# Patient Record
Sex: Female | Born: 1970 | Hispanic: No | Marital: Married | Smoking: Never smoker
Health system: Southern US, Community
[De-identification: ages and names within clinical notes are randomized; demographics above are authoritative.]

## PROBLEM LIST (undated history)

## (undated) HISTORY — PX: AUGMENTATION MAMMAPLASTY: SUR837

## (undated) HISTORY — PX: BLADDER SUSPENSION: SHX72

## (undated) HISTORY — PX: OTHER SURGICAL HISTORY: SHX169

## (undated) HISTORY — PX: SYMPATHECTOMY: SHX792

---

## 2008-01-26 ENCOUNTER — Ambulatory Visit (HOSPITAL_BASED_OUTPATIENT_CLINIC_OR_DEPARTMENT_OTHER): Admission: RE | Admit: 2008-01-26 | Discharge: 2008-01-26 | Payer: Self-pay | Admitting: Urology

## 2008-01-27 ENCOUNTER — Ambulatory Visit (HOSPITAL_COMMUNITY): Admission: RE | Admit: 2008-01-27 | Discharge: 2008-01-27 | Payer: Self-pay | Admitting: Urology

## 2010-10-23 NOTE — Op Note (Signed)
NAME:  Amy Campos, Amy Campos NO.:  [id number]   MEDICAL RECORD NO.:  [id number]          PATIENT TYPE:  AMB   LOCATION:  [facility]                         FACILITY:  [facility]   PHYSICIAN:  Excell Seltzer. Annabell Howells, M.D.    DATE OF BIRTH:  09-21-1970   DATE OF PROCEDURE:  01/26/2008  DATE OF DISCHARGE:                               OPERATIVE REPORT   PROCEDURE:  SPARC sling.   PREOPERATIVE DIAGNOSIS:  Stress urinary incontinence.   POSTOPERATIVE DIAGNOSIS:  Stress urinary incontinence.   SURGEON:  Excell Seltzer. Annabell Howells, M.D.   ANESTHESIA:  General.   DRAIN:  Foley catheter and vaginal pack.   BLOOD LOSS:  400 cc.   COMPLICATIONS:  None.   INDICATIONS:  Amy Campos is a 40 year old white female with stress  incontinence.  She is to undergo a sling.  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   FINDINGS AT PROCEDURE:  The patient is given Cipro.  She was taken to  the operating room were PAS hose were placed.  A general anesthetic was  induced and she was placed in lithotomy position.  Her mons was clipped.  She was prepped with Betadine solution and draped in the usual sterile  fashion.  A Foley catheter was inserted and the bladder was drained.  Two small incisions were made approximately 2 cm lateral to the midline  over the pubis, one on the right, one on the left.  The fat was spread  to the fascia.  A weighted vaginal retractor was placed.  The anterior  vaginal wall of the mid urethra was infiltrated with approximately 5 mL  1% lidocaine with epinephrine and an incision was made longitudinally  over the mid urethra.  The mucosa was elevated off the pubourethral  fascia, allowing placement of a finger to protect the urethra.  The  [facility] trocars were then brought onto the field.  The right was passed  initially through the fascia, along the back of the pubis until it could  be palpated with the finger in the right vaginal incision and brought  into the vaginal  vault.  The left was then passed in identical fashion.  She did have  moderate bleeding of venous nature after passage of the  trocars.  Cystoscopy was then performed with a 22-French scope and 70  degrees lens.  Examination revealed no evidence of bladder wall injury.  The bladder was then drained once again.  The spark mesh was secured to  the trocars and brought into proper position.  Repeat cystoscopy once  again revealed no bladder wall injury.  The ureteral orifices were  unremarkable, effluxing urine.  The bladder was drained again.  The  Foley catheter was reinserted.  The balloon was filled with 10 mL of  sterile fluid, and the spark mesh was then positioned with minimal  tension in the mid urethral level, as per routine.  The sheath was  removed from the mesh on each side.  The anterior vaginal wall was  closed.  There was some moderate bleeding during this procedure that was  once again venous in  appearance.  Once the anterior vaginal wall was  closed, a 2-inch Iodoform vaginal pack was placed.  The abdominal ends  of the mesh were then trimmed and allowed to drop back into the  subcutaneous space.  The abdominal incisions were closed using  Dermabond.  The Foley catheter was placed to straight drainage.  The  patient was taken down from lithotomy position.  Her anesthetic was  reversed.  She  was admitted to the recovery room in stable condition.  Blood loss was  approximately 400 mL.  There were no other complications during the  procedure.  An  H and H will be obtained in the PACU.  She will be kept  overnight with the packing and Foley in place because of the moderate  bleeding.  An H and H will be repeated in the morning.      Excell Seltzer. Annabell Howells, M.D.  Electronically Signed     JJW/MEDQ  D:  01/26/2008  T:  01/26/2008  Job:  [id number]

## 2013-06-11 ENCOUNTER — Other Ambulatory Visit: Payer: Self-pay | Admitting: *Deleted

## 2013-06-11 DIAGNOSIS — Z1231 Encounter for screening mammogram for malignant neoplasm of breast: Secondary | ICD-10-CM

## 2013-07-06 ENCOUNTER — Ambulatory Visit: Payer: Self-pay

## 2013-07-06 ENCOUNTER — Other Ambulatory Visit: Payer: Self-pay | Admitting: *Deleted

## 2013-07-06 ENCOUNTER — Ambulatory Visit
Admission: RE | Admit: 2013-07-06 | Discharge: 2013-07-06 | Disposition: A | Discharge status: Home / Self Care | Payer: TRICARE For Life (TFL) | Source: Ambulatory Visit | Attending: *Deleted | Admitting: *Deleted

## 2013-07-06 DIAGNOSIS — Z1231 Encounter for screening mammogram for malignant neoplasm of breast: Secondary | ICD-10-CM

## 2015-10-19 ENCOUNTER — Ambulatory Visit (INDEPENDENT_AMBULATORY_CARE_PROVIDER_SITE_OTHER): Admitting: Gynecology

## 2015-10-19 ENCOUNTER — Encounter: Payer: Self-pay | Admitting: Gynecology

## 2015-10-19 VITALS — BP 118/76 | Ht 68.0 in | Wt 136.0 lb

## 2015-10-19 DIAGNOSIS — Z01419 Encounter for gynecological examination (general) (routine) without abnormal findings: Secondary | ICD-10-CM

## 2015-10-19 DIAGNOSIS — Z13220 Encounter for screening for lipoid disorders: Secondary | ICD-10-CM

## 2015-10-19 NOTE — Progress Notes (Signed)
    Amy Campos 04-24-1971 [id number]        45 y.o.  G4P4 new patient for annual exam.  Several issues noted below.  Past medical history,surgical history, problem list, medications, allergies, family history and social history were all reviewed and documented as reviewed in the EPIC chart.  ROS:  Performed with pertinent positives and negatives included in the history, assessment and plan.   Additional significant findings :  none   Exam: Kennon PortelaKim Gardner assistant Filed Vitals:   10/19/15 1203  BP: 118/76  Height: 5\' 8"  (1.727 m)  Weight: 136 lb (61.689 kg)   General appearance:  Normal affect, orientation and appearance. Skin: Grossly normal HEENT: Without gross lesions.  No cervical or supraclavicular adenopathy. Thyroid normal.  Lungs:  Clear without wheezing, rales or rhonchi Cardiac: RR, without RMG Abdominal:  Soft, nontender, without masses, guarding, rebound, organomegaly or hernia Breasts:  Examined lying and sitting without masses, retractions, discharge or axillary adenopathy.  Bilateral implants noted Pelvic:  Ext/BUS/vagina normal  Cervix normal  Uterus anteverted, normal size, shape and contour, midline and mobile nontender   Adnexa without masses or tenderness    Anus and perineum normal   Rectovaginal normal sphincter tone without palpated masses or tenderness.    Assessment/Plan:  45 y.o. 634P4 female for annual exam with regular menses, vasectomy birth control.   1. Rectal spasms. Patient has a long history of having rectal spasm seems to correlate with ovulation and beginning of her menses. Can be quite painful at times. Saw gastroenterologist who offered Valium intermittently but she declined. Options for follow up with gastroenterologist now discussed. Also possible trial of estrogen patch during her menses to see if this does not help. It does seem to occur when she has a drop in estrogen such as at ovulation and during her menses. Did review with her  that I can't explain really how estrogen replacement into that but we could give it a try. At this point the patient is not interested in trying a but would prefer just to monitor. 2. History of bladder suspension. Notes she is having some loss of urine with vigorous exercise. It's been 7 or 8 years since her surgery. Discussed options for referral to urology to consider additional surgery. Patient is not interested at this time. Check urinalysis today. 3. Mammography 2015. Recommended patient schedule screening mammogram now and she agrees to call the breast center where her last mammogram was done to schedule. S/P month reviewed. 4. Pap smear 2-3 years ago. Pap smear/HPV today. No history of abnormal Pap smears previously. 5. Health maintenance. Future orders for CBC, CMP and lipid profile placed. Patient will follow up fasting to have drawn. Follow up in one year, sooner as needed.   Dara LordsFONTAINE,[name] P MD, 12:52 PM 10/19/2015

## 2015-10-19 NOTE — Patient Instructions (Signed)
Scheduling your mammogram at the breast center.  You may obtain a copy of any labs that were done today by logging onto MyChart as outlined in the instructions provided with your AVS (after visit summary). The office will not call with normal lab results but certainly if there are any significant abnormalities then we will contact you.   Health Maintenance Adopting a healthy lifestyle and getting preventive care can go a long way to promote health and wellness. Talk with your health care provider about what schedule of regular examinations is right for you. This is a good chance for you to check in with your provider about disease prevention and staying healthy. In between checkups, there are plenty of things you can do on your own. Experts have done a lot of research about which lifestyle changes and preventive measures are most likely to keep you healthy. Ask your health care provider for more information. WEIGHT AND DIET  Eat a healthy diet  Be sure to include plenty of vegetables, fruits, low-fat dairy products, and lean protein.  Do not eat a lot of foods high in solid fats, added sugars, or salt.  Get regular exercise. This is one of the most important things you can do for your health.  Most adults should exercise for at least 150 minutes each week. The exercise should increase your heart rate and make you sweat (moderate-intensity exercise).  Most adults should also do strengthening exercises at least twice a week. This is in addition to the moderate-intensity exercise.  Maintain a healthy weight  Body mass index (BMI) is a measurement that can be used to identify possible weight problems. It estimates body fat based on height and weight. Your health care provider can help determine your BMI and help you achieve or maintain a healthy weight.  For females 46 years of age and older:   A BMI below 18.5 is considered underweight.  A BMI of 18.5 to 24.9 is normal.  A BMI of 25 to  29.9 is considered overweight.  A BMI of 30 and above is considered obese.  Watch levels of cholesterol and blood lipids  You should start having your blood tested for lipids and cholesterol at 47 years of age, then have this test every 5 years.  You may need to have your cholesterol levels checked more often if:  Your lipid or cholesterol levels are high.  You are older than 47 years of age.  You are at high risk for heart disease.  CANCER SCREENING   Lung Cancer  Lung cancer screening is recommended for adults 46-34 years old who are at high risk for lung cancer because of a history of smoking.  A yearly low-dose CT scan of the lungs is recommended for people who:  Currently smoke.  Have quit within the past 15 years.  Have at least a 30-pack-year history of smoking. A pack year is smoking an average of one pack of cigarettes a day for 1 year.  Yearly screening should continue until it has been 15 years since you quit.  Yearly screening should stop if you develop a health problem that would prevent you from having lung cancer treatment.  Breast Cancer  Practice breast self-awareness. This means understanding how your breasts normally appear and feel.  It also means doing regular breast self-exams. Let your health care provider know about any changes, no matter how small.  If you are in your 20s or 30s, you should have a clinical breast exam (  CBE) by a health care provider every 1-3 years as part of a regular health exam.  If you are 40 or older, have a CBE every year. Also consider having a breast X-ray (mammogram) every year.  If you have a family history of breast cancer, talk to your health care provider about genetic screening.  If you are at high risk for breast cancer, talk to your health care provider about having an MRI and a mammogram every year.  Breast cancer gene (BRCA) assessment is recommended for women who have family members with BRCA-related  cancers. BRCA-related cancers include:  Breast.  Ovarian.  Tubal.  Peritoneal cancers.  Results of the assessment will determine the need for genetic counseling and BRCA1 and BRCA2 testing. Cervical Cancer Routine pelvic examinations to screen for cervical cancer are no longer recommended for nonpregnant women who are considered low risk for cancer of the pelvic organs (ovaries, uterus, and vagina) and who do not have symptoms. A pelvic examination may be necessary if you have symptoms including those associated with pelvic infections. Ask your health care provider if a screening pelvic exam is right for you.   The Pap test is the screening test for cervical cancer for women who are considered at risk.  If you had a hysterectomy for a problem that was not cancer or a condition that could lead to cancer, then you no longer need Pap tests.  If you are older than 65 years, and you have had normal Pap tests for the past 10 years, you no longer need to have Pap tests.  If you have had past treatment for cervical cancer or a condition that could lead to cancer, you need Pap tests and screening for cancer for at least 20 years after your treatment.  If you no longer get a Pap test, assess your risk factors if they change (such as having a new sexual partner). This can affect whether you should start being screened again.  Some women have medical problems that increase their chance of getting cervical cancer. If this is the case for you, your health care provider may recommend more frequent screening and Pap tests.  The human papillomavirus (HPV) test is another test that may be used for cervical cancer screening. The HPV test looks for the virus that can cause cell changes in the cervix. The cells collected during the Pap test can be tested for HPV.  The HPV test can be used to screen women 30 years of age and older. Getting tested for HPV can extend the interval between normal Pap tests from  three to five years.  An HPV test also should be used to screen women of any age who have unclear Pap test results.  After 47 years of age, women should have HPV testing as often as Pap tests.  Colorectal Cancer  This type of cancer can be detected and often prevented.  Routine colorectal cancer screening usually begins at 47 years of age and continues through 47 years of age.  Your health care provider may recommend screening at an earlier age if you have risk factors for colon cancer.  Your health care provider may also recommend using home test kits to check for hidden blood in the stool.  A small camera at the end of a tube can be used to examine your colon directly (sigmoidoscopy or colonoscopy). This is done to check for the earliest forms of colorectal cancer.  Routine screening usually begins at age 50.    Direct examination of the colon should be repeated every 5-10 years through 47 years of age. However, you may need to be screened more often if early forms of precancerous polyps or small growths are found. Skin Cancer  Check your skin from head to toe regularly.  Tell your health care provider about any new moles or changes in moles, especially if there is a change in a mole's shape or color.  Also tell your health care provider if you have a mole that is larger than the size of a pencil eraser.  Always use sunscreen. Apply sunscreen liberally and repeatedly throughout the day.  Protect yourself by wearing long sleeves, pants, a wide-brimmed hat, and sunglasses whenever you are outside. HEART DISEASE, DIABETES, AND HIGH BLOOD PRESSURE   Have your blood pressure checked at least every 1-2 years. High blood pressure causes heart disease and increases the risk of stroke.  If you are between 55 years and 79 years old, ask your health care provider if you should take aspirin to prevent strokes.  Have regular diabetes screenings. This involves taking a blood sample to check  your fasting blood sugar level.  If you are at a normal weight and have a low risk for diabetes, have this test once every three years after 47 years of age.  If you are overweight and have a high risk for diabetes, consider being tested at a younger age or more often. PREVENTING INFECTION  Hepatitis B  If you have a higher risk for hepatitis B, you should be screened for this virus. You are considered at high risk for hepatitis B if:  You were born in a country where hepatitis B is common. Ask your health care provider which countries are considered high risk.  Your parents were born in a high-risk country, and you have not been immunized against hepatitis B (hepatitis B vaccine).  You have HIV or AIDS.  You use needles to inject street drugs.  You live with someone who has hepatitis B.  You have had sex with someone who has hepatitis B.  You get hemodialysis treatment.  You take certain medicines for conditions, including cancer, organ transplantation, and autoimmune conditions. Hepatitis C  Blood testing is recommended for:  Everyone born from 1945 through 1965.  Anyone with known risk factors for hepatitis C. Sexually transmitted infections (STIs)  You should be screened for sexually transmitted infections (STIs) including gonorrhea and chlamydia if:  You are sexually active and are younger than 47 years of age.  You are older than 47 years of age and your health care provider tells you that you are at risk for this type of infection.  Your sexual activity has changed since you were last screened and you are at an increased risk for chlamydia or gonorrhea. Ask your health care provider if you are at risk.  If you do not have HIV, but are at risk, it may be recommended that you take a prescription medicine daily to prevent HIV infection. This is called pre-exposure prophylaxis (PrEP). You are considered at risk if:  You are sexually active and do not regularly use  condoms or know the HIV status of your partner(s).  You take drugs by injection.  You are sexually active with a partner who has HIV. Talk with your health care provider about whether you are at high risk of being infected with HIV. If you choose to begin PrEP, you should first be tested for HIV. You should then be   tested every 3 months for as long as you are taking PrEP.  PREGNANCY   If you are premenopausal and you may become pregnant, ask your health care provider about preconception counseling.  If you may become pregnant, take 400 to 800 micrograms (mcg) of folic acid every day.  If you want to prevent pregnancy, talk to your health care provider about birth control (contraception). OSTEOPOROSIS AND MENOPAUSE   Osteoporosis is a disease in which the bones lose minerals and strength with aging. This can result in serious bone fractures. Your risk for osteoporosis can be identified using a bone density scan.  If you are 65 years of age or older, or if you are at risk for osteoporosis and fractures, ask your health care provider if you should be screened.  Ask your health care provider whether you should take a calcium or vitamin D supplement to lower your risk for osteoporosis.  Menopause may have certain physical symptoms and risks.  Hormone replacement therapy may reduce some of these symptoms and risks. Talk to your health care provider about whether hormone replacement therapy is right for you.  HOME CARE INSTRUCTIONS   Schedule regular health, dental, and eye exams.  Stay current with your immunizations.   Do not use any tobacco products including cigarettes, chewing tobacco, or electronic cigarettes.  If you are pregnant, do not drink alcohol.  If you are breastfeeding, limit how much and how often you drink alcohol.  Limit alcohol intake to no more than 1 drink per day for nonpregnant women. One drink equals 12 ounces of beer, 5 ounces of wine, or 1 ounces of hard  liquor.  Do not use street drugs.  Do not share needles.  Ask your health care provider for help if you need support or information about quitting drugs.  Tell your health care provider if you often feel depressed.  Tell your health care provider if you have ever been abused or do not feel safe at home. Document Released: 12/10/2010 Document Revised: 10/11/2013 Document Reviewed: 04/28/2013 [facility] Patient Information 2015 [place], [place]. This information is not intended to replace advice given to you by your health care provider. Make sure you discuss any questions you have with your health care provider.

## 2015-10-20 LAB — URINALYSIS W MICROSCOPIC + REFLEX CULTURE
Bilirubin Urine: NEGATIVE
Casts: NONE SEEN [LPF]
Crystals: NONE SEEN [HPF]
Glucose, UA: NEGATIVE
Hgb urine dipstick: NEGATIVE
Ketones, ur: NEGATIVE
Leukocytes, UA: NEGATIVE
Nitrite: NEGATIVE
Protein, ur: NEGATIVE
Specific Gravity, Urine: 1.018 (ref 1.001–1.035)
WBC, UA: NONE SEEN WBC/HPF
Yeast: NONE SEEN [HPF]
pH: 6.5 (ref 5.0–8.0)

## 2015-10-20 LAB — PAP IG AND HPV HIGH-RISK: HPV DNA High Risk: NOT DETECTED

## 2015-10-22 LAB — URINE CULTURE

## 2015-10-23 ENCOUNTER — Telehealth: Payer: Self-pay | Admitting: *Deleted

## 2015-10-23 MED ORDER — SULFAMETHOXAZOLE-TRIMETHOPRIM 800-160 MG PO TABS: 1.0000 | ORAL_TABLET | Freq: Two times a day (BID) | ORAL | Status: DC

## 2015-10-23 NOTE — Telephone Encounter (Signed)
Tell patient her urine did grow bacteria. Recommend Septra DS 1 by mouth twice a day 3 days. Her Pap smear was normal.Notes Recorded by Dara Lordsimothy P Fontaine, MD on 10/23/2015 at 8:03 AM

## 2015-12-13 ENCOUNTER — Other Ambulatory Visit: Payer: Self-pay | Admitting: Neurosurgery

## 2015-12-13 DIAGNOSIS — M47816 Spondylosis without myelopathy or radiculopathy, lumbar region: Secondary | ICD-10-CM

## 2015-12-17 ENCOUNTER — Ambulatory Visit
Admission: RE | Admit: 2015-12-17 | Discharge: 2015-12-17 | Disposition: A | Discharge status: Home / Self Care | Payer: TRICARE For Life (TFL) | Source: Ambulatory Visit | Attending: Neurosurgery | Admitting: Neurosurgery

## 2015-12-17 DIAGNOSIS — M47816 Spondylosis without myelopathy or radiculopathy, lumbar region: Secondary | ICD-10-CM

## 2016-10-21 ENCOUNTER — Ambulatory Visit (INDEPENDENT_AMBULATORY_CARE_PROVIDER_SITE_OTHER): Admitting: Gynecology

## 2016-10-21 ENCOUNTER — Encounter: Payer: Self-pay | Admitting: Gynecology

## 2016-10-21 VITALS — BP 120/78 | Ht 68.0 in | Wt 138.0 lb

## 2016-10-21 DIAGNOSIS — I499 Cardiac arrhythmia, unspecified: Secondary | ICD-10-CM

## 2016-10-21 DIAGNOSIS — Z01419 Encounter for gynecological examination (general) (routine) without abnormal findings: Secondary | ICD-10-CM

## 2016-10-21 DIAGNOSIS — R079 Chest pain, unspecified: Secondary | ICD-10-CM | POA: Diagnosis not present

## 2016-10-21 NOTE — Patient Instructions (Signed)
Call to Schedule your mammogram  Facilities in [place]: 1)  [facility]. [facility], [address] Phone: [phone] 2)  Dr. Bertrand at [facility]  [address] Phone: [phone]     Mammogram A mammogram is an X-ray test to find changes in a woman's breast. You should get a mammogram if:  You are 48 years of age or older  You have risk factors.   Your doctor recommends that you have one.  BEFORE THE TEST  Do not schedule the test the week before your period, especially if your breasts are sore during this time.  On the day of your mammogram:  Wash your breasts and armpits well. After washing, do not put on any deodorant or talcum powder on until after your test.   Eat and drink as you usually do.   Take your medicines as usual.   If you are diabetic and take insulin, make sure you:   Eat before coming for your test.   Take your insulin as usual.   If you cannot keep your appointment, call before the appointment to cancel. Schedule another appointment.  TEST  You will need to undress from the waist up. You will put on a hospital gown.   Your breast will be put on the mammogram machine, and it will press firmly on your breast with a piece of plastic called a compression paddle. This will make your breast flatter so that the machine can X-ray all parts of your breast.   Both breasts will be X-rayed. Each breast will be X-rayed from above and from the side. An X-ray might need to be taken again if the picture is not good enough.   The mammogram will last about 15 to 30 minutes.  AFTER THE TEST Finding out the results of your test Ask when your test results will be ready. Make sure you get your test results.  Document Released: 08/23/2008 Document Revised: 05/16/2011 Document Reviewed: 08/23/2008 [employer] Patient Information 2012 [employer].   

## 2016-10-21 NOTE — Progress Notes (Signed)
    Amy Campos M Dunavan [birth year] [id number]        46 y.o.  G4P4 for annual exam.  Patient also notes over the past several months intermittent fleeting left-sided chest pain. Not necessarily associated with exercise. Last for 30 seconds or so. No lightheadedness, dizziness, nausea, radiation of the discomfort. Also notes some discomfort that she associated with reflux. Patient also notes occasional skips in her heartbeat and what she feels are runs of SVT. Did have a Holter several years ago but did not demonstrate any pathology.  Past medical history,surgical history, problem list, medications, allergies, family history and social history were all reviewed and documented as reviewed in the EPIC chart.  ROS:  Performed with pertinent positives and negatives included in the history, assessment and plan.   Additional significant findings :  None   Exam: Kennon PortelaKim Gardner assistant Vitals:   10/21/16 1213  BP: 120/78  Weight: 138 lb (62.6 kg)  Height: 5\' 8"  (1.727 m)   Body mass index is 20.98 kg/m.  General appearance:  Normal affect, orientation and appearance. Skin: Grossly normal HEENT: Without gross lesions.  No cervical or supraclavicular adenopathy. Thyroid normal.  Lungs:  Clear without wheezing, rales or rhonchi Cardiac: RR, without RMG Abdominal:  Soft, nontender, without masses, guarding, rebound, organomegaly or hernia Breasts:  Examined lying and sitting without masses, retractions, discharge or axillary adenopathy. Bilateral implants noted  Pelvic:  Ext, BUS, Vagina: Normal with slight menses flow  Cervix: Normal  Uterus: Anteverted, normal size, shape and contour, midline and mobile nontender   Adnexa: Without masses or tenderness    Anus and perineum: Normal   Rectovaginal: Normal sphincter tone without palpated masses or tenderness.    Assessment/Plan:  46 y.o. 664P4 female for annual exam with regular menses, vasectomy birth control..   1. Chest discomfort,  fleeting with mild skips in heartbeat and questionable runs of SVT by her history. No associated symptoms to suggest more concerning cardiac disease. Reviewed with patient though that in women symptoms can be much different then men in as far as cardiac disease. Exam today is normal with regular pulse and no evidence of skips. No rubs murmurs or gallops. Options for further evaluation reviewed to include follow up with her primary physician and start with EKG versus evaluation by cardiology to see if anything more involved needed to be done to include stress test. My recommendation would be to follow up with cardiology and let them review her history and see what testing they would recommend. Patient agrees with this and will make arrangements for her. 2. Pap smear/HPV 10/2015. No Pap smear done today. No history of abnormal Pap smears. Plan repeat Pap smear at 5 year interval per current screening guidelines. 3. Mammography 2015. I again recommended screening mammography in the patient agrees to call and schedule this year. SBE monthly reviewed. 4. Health maintenance. Patient reports routine lab work done within the past year at her primary physician's office. Has a borderline elevated cholesterol that they're following. No lab work ordered today. Follow up in one year for GYN exam, follow up sooner for cardiology evaluation.  Additional time in excess of her routine gynecologic exam was spent in direct face to face counseling and coordination of care in regards to her chest discomfort and cardiac arrhythmia.    Dara LordsFONTAINE,Juwuan Sedita P MD, 12:49 PM 10/21/2016

## 2016-11-15 ENCOUNTER — Telehealth: Payer: Self-pay

## 2016-11-15 NOTE — Telephone Encounter (Signed)
Patient called because she was to be referred to cardiologist and has not heard about appt. I sent message to VolinJennifer with info from office note.

## 2016-11-19 ENCOUNTER — Telehealth: Payer: Self-pay | Admitting: *Deleted

## 2016-11-19 DIAGNOSIS — R0789 Other chest pain: Secondary | ICD-10-CM

## 2016-11-19 DIAGNOSIS — I499 Cardiac arrhythmia, unspecified: Secondary | ICD-10-CM

## 2016-11-19 NOTE — Telephone Encounter (Signed)
Patient called regarding cardiology referral, message was never sent to me to schedule this from office visit on 10/21/16, referral placed at [facility]ology they will contact pt to schedule for the below  "Chest discomfort, fleeting with mild skips in heartbeat and questionable runs of SVT by her history. No associated symptoms to suggest more concerning cardiac disease. Reviewed with patient though that in women symptoms can be much different then men in as far as cardiac disease. Exam today is normal with regular pulse and no evidence of skips. No rubs murmurs or gallops. Options for further evaluation reviewed to include follow up with her primary physician and start with EKG versus evaluation by cardiology to see if anything more involved needed to be done to include stress test. My recommendation would be to follow up with cardiology and let them review her history and see what testing they would recommend. Patient agrees with this and will make arrangements for her.

## 2016-12-04 NOTE — Telephone Encounter (Signed)
Appointment on 724/18 with Dr.Weaver

## 2016-12-12 ENCOUNTER — Other Ambulatory Visit: Payer: Self-pay | Admitting: Gynecology

## 2016-12-12 DIAGNOSIS — Z1231 Encounter for screening mammogram for malignant neoplasm of breast: Secondary | ICD-10-CM

## 2016-12-18 ENCOUNTER — Ambulatory Visit (INDEPENDENT_AMBULATORY_CARE_PROVIDER_SITE_OTHER): Admitting: Physician Assistant

## 2016-12-18 ENCOUNTER — Encounter (INDEPENDENT_AMBULATORY_CARE_PROVIDER_SITE_OTHER): Payer: Self-pay

## 2016-12-18 ENCOUNTER — Encounter: Payer: Self-pay | Admitting: Physician Assistant

## 2016-12-18 VITALS — BP 126/84 | HR 60 | Ht 68.0 in | Wt 135.0 lb

## 2016-12-18 DIAGNOSIS — R002 Palpitations: Secondary | ICD-10-CM | POA: Diagnosis not present

## 2016-12-18 DIAGNOSIS — R0789 Other chest pain: Secondary | ICD-10-CM | POA: Diagnosis not present

## 2016-12-18 NOTE — Patient Instructions (Addendum)
Medication Instructions:  Your physician recommends that you continue on your current medications as directed. Please refer to the Current Medication list given to you today.   Labwork: TODAY TSH   Testing/Procedures: 1. Your physician has requested that you have an echocardiogram. Echocardiography is a painless test that uses sound waves to create images of your heart. It provides your doctor with information about the size and shape of your heart and how well your heart's chambers and valves are working. This procedure takes approximately one hour. There are no restrictions for this procedure.  2. Your physician has requested that you have an exercise tolerance test. For further information please visit [email or web address]. Please also follow instruction sheet, as given.  3. YOU WILL NEED TO BE SCHEDULED FOR A CARDIAC CT SCORING  Follow-Up: 3 MONTHS WITH DR. CRENSHAW  Any Other Special Instructions Will Be Listed Below (If Applicable).     If you need a refill on your cardiac medications before your next appointment, please call your pharmacy.

## 2016-12-18 NOTE — Progress Notes (Signed)
Cardiology Office Note    Date:  12/19/2016   ID:  KENNYA SCHWENN, DOB 02/24/1971, MRN [id number]  PCP:  [facility]  Cardiologist:  New - case discussed with Dr. Jens Som  Chief Complaint  Patient presents with  . New Patient (Initial Visit)    case discussed with Dr. Jens Som DOD.      History of Present Illness:  Amy Campos is a 46 y.o. female with no significant past medical history or past cardiac history who presented today for evaluation of chest discomfort and occasional skipped beat and one episode of questionable SVT. According to the patient, she used to work as a Engineer, civil (consulting) many years ago. For the past 2 months, she occasionally feels a skipped heartbeat when she checked her radial pulse. She does not have actual cardiac awareness of skipped heartbeat. She also had an episode of SVT while working out on the treadmill 2 weeks ago. She says she feels her heart rate suddenly went up to 150 bpm, she performed a Valsalva maneuver and quickly came out of the rhythm. That was the first time and the last time she ever felt something like that. She described this as tachycardia palpitation. She has been noticing some very vague chest discomfort radiating up to the left shoulder. It does not seems to have strong correlation with exertion. Otherwise, she denies any significant lower extremity edema, orthopnea or paroxysmal nocturnal dyspnea.  Given her symptom of skipped heartbeat, this is likely PVC, I did not recommend any treatment at this time especially since it is fairly infrequent. As far as her SVT episode, there was only one solitary episode, there has been no precedence and in no recurrence since then. I did not recommend any medical therapy as this time. I did recommend echocardiogram to rule out structural heart disease. Her chest pain is fairly atypical, it does not have strong correlation with exertion. I recommended a GXT and cardiac  calcium score. She does not have significant risk factor for coronary artery disease.   No past medical history on file.  Past Surgical History:  Procedure Laterality Date  . AUGMENTATION MAMMAPLASTY    . BLADDER SUSPENSION    . CESAREAN SECTION    . SYMPATHECTOMY     for excessive sweating-Hands and feet    Current Medications: No outpatient prescriptions prior to visit.   No facility-administered medications prior to visit.      Allergies:   Keflex [cephalexin]   Social History   Social History  . Marital status: Married    Spouse name: N/A  . Number of children: N/A  . Years of education: N/A   Social History Main Topics  . Smoking status: Never Smoker  . Smokeless tobacco: Never Used  . Alcohol use 1.8 oz/week    3 Standard drinks or equivalent per week     Comment: socially  . Drug use: No  . Sexual activity: Yes    Birth control/ protection: Surgical     Comment: Vasectomy-1st intercourse 46 yo-Fewer than 5 partners vasectomy   Other Topics Concern  . None   Social History Narrative  . None     Family History:  The patient's family history includes Alcohol abuse in her paternal grandfather; Alzheimer's disease (age of onset: 37) in her maternal grandmother; Heart Problems in her mother; Leukemia (age of onset: 44) in her maternal grandfather.   ROS:   Please see the history of present illness.  ROS All other systems reviewed and are negative.   PHYSICAL EXAM:   VS:  BP 126/84   Pulse 60   Ht 5\' 8"  (1.727 m)   Wt 135 lb (61.2 kg)   BMI 20.53 kg/m    GEN: Well nourished, well developed, in no acute distress  HEENT: normal  Neck: no JVD, carotid bruits, or masses Cardiac: RRR; no murmurs, rubs, or gallops,no edema  Respiratory:  clear to auscultation bilaterally, normal work of breathing GI: soft, nontender, nondistended, + BS MS: no deformity or atrophy  Skin: warm and dry, no rash Neuro:  Alert and Oriented x 3, Strength and sensation are  intact Psych: euthymic mood, full affect  Wt Readings from Last 3 Encounters:  12/18/16 135 lb (61.2 kg)  10/21/16 138 lb (62.6 kg)  10/19/15 136 lb (61.7 kg)      Studies/Labs Reviewed:   EKG:  EKG is ordered today.  The ekg ordered today demonstrates Normal sinus rhythm heart rate 60, no significant ST-T wave changes normal EKG   Recent Labs: 12/18/2016: TSH 0.711   Lipid Panel No results found for: CHOL, TRIG, HDL, CHOLHDL, VLDL, LDLCALC, LDLDIRECT  Additional studies/ records that were reviewed today include:   Recent EKG.   ASSESSMENT:    1. Chest pressure   2. Palpitations      PLAN:  In order of problems listed above:  1. Chest pressure: Somewhat atypical, does not seems to be related to exertion. I plan to obtain GXT and cardiac calcium score. She is aware that most insurance does not cover cardiac calcium score, and this likely represent $150 out-of-pocket expense. Case has been discussed with Dr. Jens Somrenshaw who agrees.  2. Asymptomatic PVC: No treatment necessary, asymptomatic, only occurs occasional. Quite benign in this case. We will obtain echocardiogram to rule out underlying structural heart disease.  3. Tachycardia palpitation: Likely one solitary episode of SVT, resolved after Valsalva maneuver, no recurrence since. Does not need any medication at this time.   Medication Adjustments/Labs and Tests Ordered: Current medicines are reviewed at length with the patient today.  Concerns regarding medicines are outlined above.  Medication changes, Labs and Tests ordered today are listed in the Patient Instructions below. Patient Instructions  Medication Instructions:  Your physician recommends that you continue on your current medications as directed. Please refer to the Current Medication list given to you today.   Labwork: TODAY TSH   Testing/Procedures: 1. Your physician has requested that you have an echocardiogram. Echocardiography is a painless test  that uses sound waves to create images of your heart. It provides your doctor with information about the size and shape of your heart and how well your heart's chambers and valves are working. This procedure takes approximately one hour. There are no restrictions for this procedure.  2. Your physician has requested that you have an exercise tolerance test. For further information please visit [email or web address]. Please also follow instruction sheet, as given.  3. YOU WILL NEED TO BE SCHEDULED FOR A CARDIAC CT SCORING  Follow-Up: 3 MONTHS WITH DR. CRENSHAW  Any Other Special Instructions Will Be Listed Below (If Applicable).     If you need a refill on your cardiac medications before your next appointment, please call your pharmacy.      Ramond DialSigned, Arvine Clayburn, [place]  12/19/2016 6:42 PM    [facility] [address], [place], [place]  [address] Phone: [phone]; Fax: [phone]

## 2016-12-19 ENCOUNTER — Encounter: Payer: Self-pay | Admitting: Physician Assistant

## 2016-12-19 LAB — TSH: TSH: 0.711 u[IU]/mL (ref 0.450–4.500)

## 2016-12-19 NOTE — Progress Notes (Signed)
TSH normal

## 2016-12-20 ENCOUNTER — Ambulatory Visit
Admission: RE | Admit: 2016-12-20 | Discharge: 2016-12-20 | Disposition: A | Discharge status: Home / Self Care | Payer: TRICARE For Life (TFL) | Source: Ambulatory Visit | Attending: Gynecology | Admitting: Gynecology

## 2016-12-20 DIAGNOSIS — Z1231 Encounter for screening mammogram for malignant neoplasm of breast: Secondary | ICD-10-CM

## 2016-12-31 ENCOUNTER — Ambulatory Visit: Payer: TRICARE For Life (TFL) | Admitting: Physician Assistant

## 2017-01-02 ENCOUNTER — Telehealth (HOSPITAL_COMMUNITY): Payer: Self-pay

## 2017-01-02 NOTE — Telephone Encounter (Signed)
Encounter complete. 

## 2017-01-03 ENCOUNTER — Inpatient Hospital Stay (HOSPITAL_COMMUNITY): Admission: RE | Admit: 2017-01-03 | Payer: TRICARE For Life (TFL) | Source: Ambulatory Visit

## 2017-01-07 ENCOUNTER — Ambulatory Visit (HOSPITAL_COMMUNITY)
Admission: RE | Admit: 2017-01-07 | Discharge: 2017-01-07 | Disposition: A | Discharge status: Home / Self Care | Payer: TRICARE For Life (TFL) | Source: Ambulatory Visit | Attending: Cardiovascular Disease | Admitting: Cardiovascular Disease

## 2017-01-07 DIAGNOSIS — R0789 Other chest pain: Secondary | ICD-10-CM | POA: Diagnosis not present

## 2017-01-07 DIAGNOSIS — R002 Palpitations: Secondary | ICD-10-CM | POA: Insufficient documentation

## 2017-01-07 LAB — EXERCISE TOLERANCE TEST
Estimated workload: 17.2 METS
Exercise duration (min): 15 min
Exercise duration (sec): 1 s
MPHR: 173 {beats}/min
Peak HR: 164 {beats}/min
Percent HR: 94 %
RPE: 18
Rest HR: 67 {beats}/min

## 2017-01-08 ENCOUNTER — Ambulatory Visit (HOSPITAL_COMMUNITY): Payer: TRICARE For Life (TFL) | Attending: Cardiology

## 2017-01-08 ENCOUNTER — Ambulatory Visit (INDEPENDENT_AMBULATORY_CARE_PROVIDER_SITE_OTHER)
Admission: RE | Admit: 2017-01-08 | Discharge: 2017-01-08 | Disposition: A | Discharge status: Home / Self Care | Payer: Self-pay | Source: Ambulatory Visit | Attending: Physician Assistant | Admitting: Physician Assistant

## 2017-01-08 ENCOUNTER — Other Ambulatory Visit: Payer: Self-pay

## 2017-01-08 DIAGNOSIS — I081 Rheumatic disorders of both mitral and tricuspid valves: Secondary | ICD-10-CM | POA: Insufficient documentation

## 2017-01-08 DIAGNOSIS — R002 Palpitations: Secondary | ICD-10-CM | POA: Diagnosis not present

## 2017-01-08 DIAGNOSIS — R0789 Other chest pain: Secondary | ICD-10-CM

## 2017-01-13 ENCOUNTER — Telehealth: Payer: Self-pay | Admitting: Cardiology

## 2017-01-13 NOTE — Telephone Encounter (Signed)
Results and recommendations discussed with patient, who verbalized understanding and thanks.  

## 2017-01-13 NOTE — Telephone Encounter (Signed)
Mrs. Amy Campos is returning a call about her Echo Results. Please call

## 2017-01-16 ENCOUNTER — Encounter: Payer: Self-pay | Admitting: Gynecology

## 2017-01-16 ENCOUNTER — Ambulatory Visit (INDEPENDENT_AMBULATORY_CARE_PROVIDER_SITE_OTHER): Admitting: Gynecology

## 2017-01-16 VITALS — BP 110/70

## 2017-01-16 DIAGNOSIS — N3000 Acute cystitis without hematuria: Secondary | ICD-10-CM | POA: Diagnosis not present

## 2017-01-16 LAB — URINALYSIS W MICROSCOPIC + REFLEX CULTURE
Bilirubin Urine: NEGATIVE
CASTS: NONE SEEN [LPF]
Crystals: NONE SEEN [HPF]
Glucose, UA: NEGATIVE
KETONES UR: NEGATIVE
NITRITE: NEGATIVE
Protein, ur: NEGATIVE
Specific Gravity, Urine: 1.015 (ref 1.001–1.035)
YEAST: NONE SEEN [HPF]
pH: 7 (ref 5.0–8.0)

## 2017-01-16 MED ORDER — SULFAMETHOXAZOLE-TRIMETHOPRIM 800-160 MG PO TABS: 1.0000 | ORAL_TABLET | Freq: Two times a day (BID) | ORAL | 0 refills | Status: DC

## 2017-01-16 NOTE — Addendum Note (Signed)
Addended by: Dayna BarkerGARDNER, Judy Pollman K on: 01/16/2017 12:59 PM   Modules accepted: Orders

## 2017-01-16 NOTE — Patient Instructions (Signed)
Take your antibiotic pill twice daily for 3 days

## 2017-01-16 NOTE — Progress Notes (Signed)
    Amy Campos [birth year] [id number]        46 y.o.  G4P4 presents with 2 day history of worsening frequency, dysuria or urgency.  No low back pain fever or chills. No vaginal symptoms such as discharge, odor or irritation. No nausea vomiting diarrhea constipation  Past medical history,surgical history, problem list, medications, allergies, family history and social history were all reviewed and documented in the EPIC chart.  Directed ROS with pertinent positives and negatives documented in the history of present illness/assessment and plan.  Exam: Vitals:   01/16/17 1220  BP: 110/70   General appearance:  Normal Spine straight without CVA tenderness Abdomen soft nontender without masses guarding rebound  Assessment/Plan:  46 y.o. G4P4 with symptoms and urinalysis consistent with UTI. Moderate bacteria with 10-20 WBC noted. Will cover with Septra DS 1 by mouth twice a day 3 days. Follow up if symptoms persist, worsen or recur.    Amy Campos,Amy Campos P MD, 12:40 PM 01/16/2017

## 2017-01-17 LAB — URINE CULTURE: ORGANISM ID, BACTERIA: NO GROWTH

## 2017-02-24 NOTE — Progress Notes (Signed)
      HPI: Follow-up chest pain and supraventricular tachycardia. Patient was seen by Azalee Course 7/18 with complaints of palpitations and chest pain. Exercise treadmill July 2018 normal. Echocardiogram August 2018 showed normal LV function and mild mitral regurgitation. Calcium score August 2018 0. Since last seen, patient has occasional palpitations described as a pause. These are not sustained. She denies exertional dyspnea, orthopnea, PND or pedal edema. Her previous chest pain was either in the right or left side of her chest and not related to exertion. lasts approximately 2 minutes and resolves.   Current Outpatient Prescriptions  Medication Sig Dispense Refill  . sulfamethoxazole-trimethoprim (BACTRIM DS,SEPTRA DS) 800-160 MG tablet Take 1 tablet by mouth 2 (two) times daily. 6 tablet 0   No current facility-administered medications for this visit.      History reviewed. No pertinent past medical history.  Past Surgical History:  Procedure Laterality Date  . AUGMENTATION MAMMAPLASTY Bilateral   . BLADDER SUSPENSION    . CESAREAN SECTION    . SYMPATHECTOMY     for excessive sweating-Hands and feet    Social History   Social History  . Marital status: Married    Spouse name: N/A  . Number of children: N/A  . Years of education: N/A   Occupational History  . Not on file.   Social History Main Topics  . Smoking status: Never Smoker  . Smokeless tobacco: Never Used  . Alcohol use 1.8 oz/week    3 Standard drinks or equivalent per week     Comment: socially  . Drug use: No  . Sexual activity: Yes    Birth control/ protection: Surgical     Comment: Vasectomy-1st intercourse 46 yo-Fewer than 5 partners vasectomy   Other Topics Concern  . Not on file   Social History Narrative  . No narrative on file    Family History  Problem Relation Age of Onset  . Heart Problems Mother        s/p pacemaker  . Alzheimer's disease Maternal Grandmother 68  . Leukemia Maternal  Grandfather 78  . Alcohol abuse Paternal Grandfather   . Breast cancer Neg Hx     ROS: no fevers or chills, productive cough, hemoptysis, dysphasia, odynophagia, melena, hematochezia, dysuria, hematuria, rash, seizure activity, orthopnea, PND, pedal edema, claudication. Remaining systems are negative.  Physical Exam: Well-developed well-nourished in no acute distress.  Skin is warm and dry.  HEENT is normal.  Neck is supple.  Chest is clear to auscultation with normal expansion.  Cardiovascular exam is regular rate and rhythm.  Abdominal exam nontender or distended. No masses palpated. Extremities show no edema. neuro grossly intact  ECG- 12/18/2016-normal sinus rhythm with no ST changes. personally reviewed  A/P  1 chest pain- symptoms are atypical. Exercise treadmill negative and calcium score 0. No plans for further ischemia evaluation. Follow-up primary care.  2 palpitations-patient sounds to be having PVCs or PACs. If they worsen in the future we will consider low-dose beta-blockade. Note she had an episode where her heart rate increased significantly and there was a question of whether this may represent SVT. However this has not been documented. If she has more frequent events in the future we will arrange an event monitor.  3 hyperlipidemia-management per primary care.  Olga Millers, MD

## 2017-03-10 ENCOUNTER — Encounter: Payer: Self-pay | Admitting: Cardiology

## 2017-03-10 ENCOUNTER — Ambulatory Visit (INDEPENDENT_AMBULATORY_CARE_PROVIDER_SITE_OTHER): Admitting: Cardiology

## 2017-03-10 VITALS — BP 130/80 | HR 62 | Ht 68.0 in

## 2017-03-10 DIAGNOSIS — E7800 Pure hypercholesterolemia, unspecified: Secondary | ICD-10-CM

## 2017-03-10 DIAGNOSIS — R002 Palpitations: Secondary | ICD-10-CM

## 2017-03-10 DIAGNOSIS — R0789 Other chest pain: Secondary | ICD-10-CM | POA: Diagnosis not present

## 2017-03-10 NOTE — Patient Instructions (Signed)
Your physician recommends that you schedule a follow-up appointment in: as needed  

## 2017-03-13 ENCOUNTER — Telehealth: Payer: Self-pay | Admitting: *Deleted

## 2017-03-13 ENCOUNTER — Other Ambulatory Visit: Payer: Self-pay | Admitting: Gynecology

## 2017-03-13 MED ORDER — CIPROFLOXACIN HCL 500 MG PO TABS: 500.0000 mg | ORAL_TABLET | Freq: Two times a day (BID) | ORAL | 0 refills | Status: DC

## 2017-03-13 NOTE — Telephone Encounter (Signed)
Patient called c/o frequent urination, burning with urination, vaginal burning as well. Pt was treated on 01/16/17 states she doesn't feel as it infection fully went away 100%. OV today or Rx? Please advise

## 2017-03-13 NOTE — Telephone Encounter (Signed)
Spoke with patient and informed her. Rx sent. 

## 2017-03-13 NOTE — Telephone Encounter (Signed)
Recommend ciprofloxacin 250 mg twice a day 7 days 

## 2017-03-19 ENCOUNTER — Ambulatory Visit (INDEPENDENT_AMBULATORY_CARE_PROVIDER_SITE_OTHER): Admitting: Gynecology

## 2017-03-19 ENCOUNTER — Encounter: Payer: Self-pay | Admitting: Gynecology

## 2017-03-19 VITALS — BP 116/74

## 2017-03-19 DIAGNOSIS — N898 Other specified noninflammatory disorders of vagina: Secondary | ICD-10-CM | POA: Diagnosis not present

## 2017-03-19 DIAGNOSIS — R300 Dysuria: Secondary | ICD-10-CM | POA: Diagnosis not present

## 2017-03-19 LAB — WET PREP FOR TRICH, YEAST, CLUE

## 2017-03-19 NOTE — Progress Notes (Signed)
    Amy Campos [birth year] [id number]        46 y.o.  G4P4 presents having been treated in August for UTI with Septra.  Is having worsening frequency, dysuria and urgency. Symptoms seem to improve somewhat but still had a nagging discomfort. Start having worsening symptoms last week and was prescribed ciprofloxacin 250 mg twice a day for 7 days with one day left in her treatment. Does not feel that her symptoms have gotten better. Notes primarily some mild dysuria with lower abdominal discomfort and some burning with urination. No urgency significant frequency low back pain fever or chills. No vaginal discharge or irritation or itching.  Past medical history,surgical history, problem list, medications, allergies, family history and social history were all reviewed and documented in the EPIC chart.  Directed ROS with pertinent positives and negatives documented in the history of present illness/assessment and plan.  Exam: Kennon Portela assistant Vitals:   03/19/17 1223  BP: 116/74   General appearance:  Normal Spine straight without CVA tenderness Abdomen soft nontender without masses guarding rebound Pelvic external BUS vagina with thick white discharge. Cervix normal. Uterus normal size midline mobile nontender. Adnexa without masses or tenderness.  Assessment/Plan:  46 y.o. G4P4 with history and exam as above. Her urine analysis is totally negative. Her wet prep is also negative. Reviewed possibilities to include residual inflammation from a treated UTI, a partially treated UTI or possibly interstitial cystitis. She'll complete her ciprofloxacin antibiotic course now, monitor her symptoms over the next week or so. If they persist she will repeat a clean catch urinalysis off of antibiotics. Ultimately if her symptoms continue then we will refer to urology to rule out interstitial cystitis. Patient understands and agrees with the plan and will call me if her symptoms persist to arrange for  repeat urinalysis.  Greater than 50% of my time was spent in direct face to face counseling and coordination of care with the patient.      Dara Lords MD, 12:37 PM 03/19/2017

## 2017-03-19 NOTE — Patient Instructions (Signed)
Follow up if your symptoms persist and we will repeat your urine analysis. If your symptoms continue then we may send you to see the urologist.

## 2017-03-20 LAB — URINALYSIS W MICROSCOPIC + REFLEX CULTURE
BILIRUBIN URINE: NEGATIVE
Bacteria, UA: NONE SEEN /HPF
GLUCOSE, UA: NEGATIVE
HGB URINE DIPSTICK: NEGATIVE
Hyaline Cast: NONE SEEN /LPF
KETONES UR: NEGATIVE
Leukocyte Esterase: NEGATIVE
NITRITES URINE, INITIAL: NEGATIVE
PH: 6 (ref 5.0–8.0)
PROTEIN: NEGATIVE
RBC / HPF: NONE SEEN /HPF (ref 0–2)
Specific Gravity, Urine: 1.015 (ref 1.001–1.03)
WBC UA: NONE SEEN /HPF (ref 0–5)

## 2017-03-20 LAB — URINE CULTURE
MICRO NUMBER: 81129209
SPECIMEN QUALITY:: ADEQUATE

## 2017-03-20 LAB — NO CULTURE INDICATED

## 2017-07-17 ENCOUNTER — Telehealth: Payer: Self-pay | Admitting: *Deleted

## 2017-07-17 NOTE — Telephone Encounter (Signed)
Pt called requesting referral to urology asked if she can be [facility], he has [place] office. Per note on 03/19/17. Referral will be made and notes will be faxed. Pt can scheduled [phone] number given to schedule, notes faxed to [phone]

## 2017-10-22 ENCOUNTER — Ambulatory Visit (INDEPENDENT_AMBULATORY_CARE_PROVIDER_SITE_OTHER): Admitting: Gynecology

## 2017-10-22 ENCOUNTER — Encounter: Payer: Self-pay | Admitting: Gynecology

## 2017-10-22 VITALS — BP 130/70 | Ht 67.0 in | Wt 137.0 lb

## 2017-10-22 DIAGNOSIS — Z01419 Encounter for gynecological examination (general) (routine) without abnormal findings: Secondary | ICD-10-CM

## 2017-10-22 NOTE — Patient Instructions (Signed)
Follow-up in 1 year for annual exam, sooner if any issues. 

## 2017-10-22 NOTE — Progress Notes (Signed)
    Shenelle Klas Setterlund Jul 01, 1970 [id number]        47 y.o.  G4P4 for annual gynecologic exam.  Doing well without gynecologic complaints.  Past medical history,surgical history, problem list, medications, allergies, family history and social history were all reviewed and documented as reviewed in the EPIC chart.  ROS:  Performed with pertinent positives and negatives included in the history, assessment and plan.   Additional significant findings : None   Exam: Kennon Portela assistant Vitals:   10/22/17 1213  BP: 130/70  Weight: 137 lb (62.1 kg)  Height:  (1.702 m)   Body mass index is 21.46 kg/m.  General appearance:  Normal affect, orientation and appearance. Skin: Grossly normal HEENT: Without gross lesions.  No cervical or supraclavicular adenopathy. Thyroid normal.  Lungs:  Clear without wheezing, rales or rhonchi Cardiac: RR, without RMG Abdominal:  Soft, nontender, without masses, guarding, rebound, organomegaly or hernia Breasts:  Examined lying and sitting without masses, retractions, discharge or axillary adenopathy.  Bilateral implants noted Pelvic:  Ext, BUS, Vagina: Normal  Cervix: Normal  Uterus: Anteverted, normal size, shape and contour, midline and mobile nontender   Adnexa: Without masses or tenderness    Anus and perineum: Normal   Rectovaginal: Normal sphincter tone without palpated masses or tenderness.    Assessment/Plan:  47 y.o. G27P4 female for annual gynecologic exam with regular menses, vasectomy birth control.   1. Pap smear/HPV 10/2015.  No Pap smear done today.  No history of abnormal Pap smears.  Plan repeat Pap smear/HPV at 5-year interval per current screening guidelines. 2. Mammography coming due in July and she will follow-up for this.  Breast exam normal today. 3. Health maintenance.  Patient evaluated by Dr. Logan Bores due to some bladder symptoms.  He did not feel that she had interstitial cystitis but she does note improvement in her  symptoms with diet modifications.  Had work-up for atypical chest pain which was all negative last year.  Blood pressure 130/70 mentioned the patient relates having it checked at home which runs normal.  Discussed screening blood work and patient declined any blood work.  Follow-up in 1 year, sooner as needed.   Dara Lords MD, 1:00 PM 10/22/2017

## 2018-10-10 IMAGING — CT CT HEART SCORING
2 series · 16 of 20 positions shown, 18 images · non-contrast
Comparison: None.

CLINICAL DATA: Risk stratification

EXAM:
Coronary Calcium Score
TECHNIQUE: The patient was scanned on a Siemens Somatom 64 slice scanner. Axial
non-contrast 3 mm slices were carried out through the heart. The
data set was analyzed on a dedicated work station and scored using
the Agatson method.

[Series 2: casc 3.0 i36f 2 bestdiast 68 % · axial · 0.31mm/px · z∈[-302,-186]mm · 8 of 51 slices shown, 10 images]
[im 6/51  vessel]
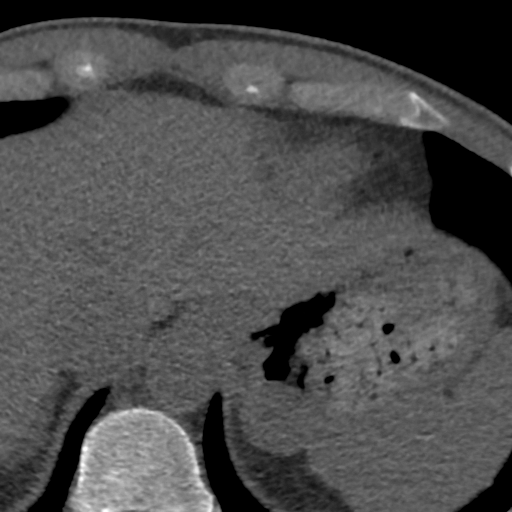
[im 6/51  lung]
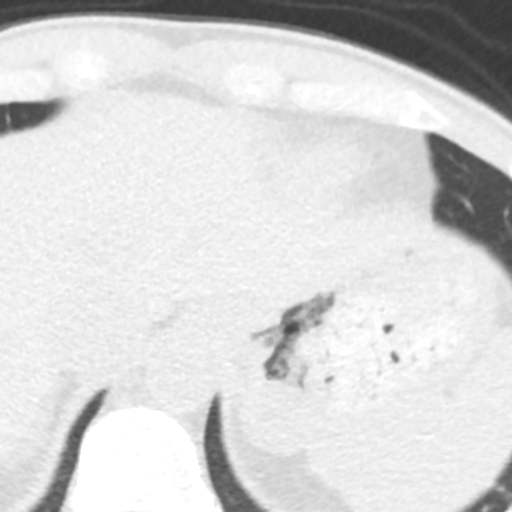
[im 12/51  vessel]
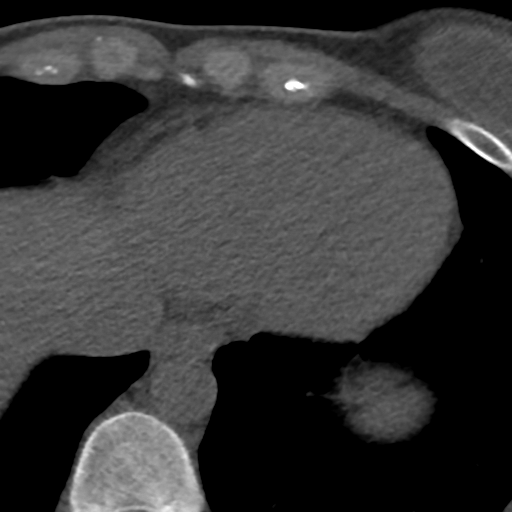
[im 17/51  vessel]
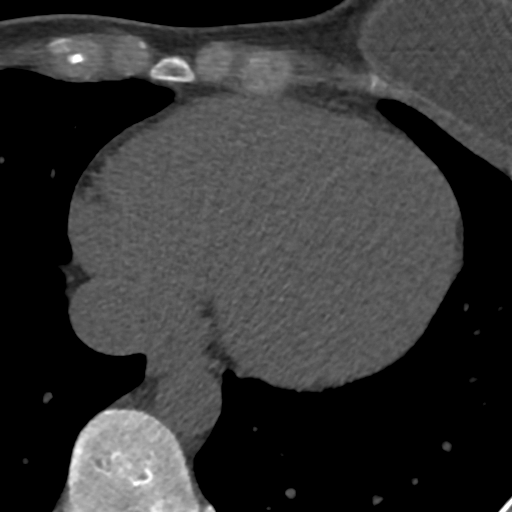
[im 23/51  vessel]
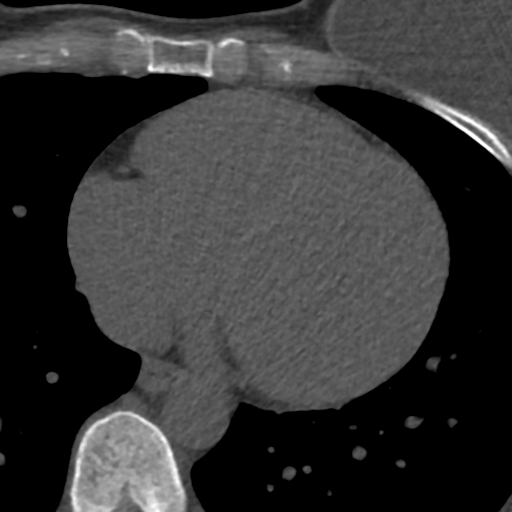
[im 28/51  vessel]
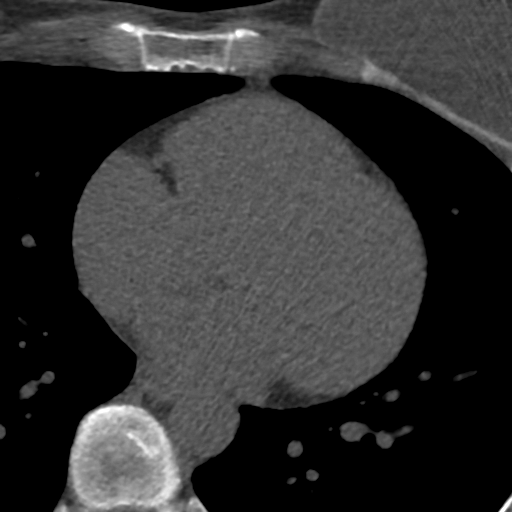
[im 28/51  lung]
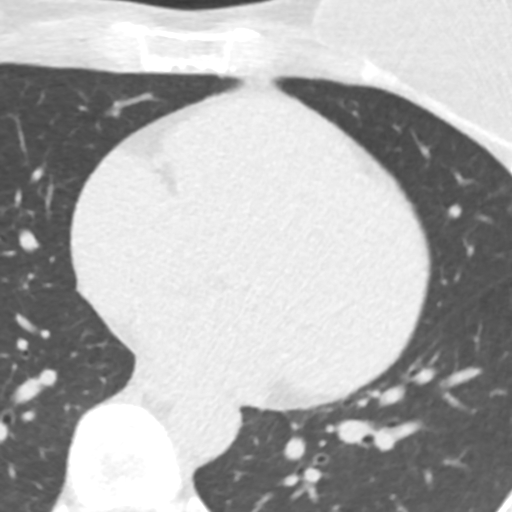
[im 34/51  vessel]
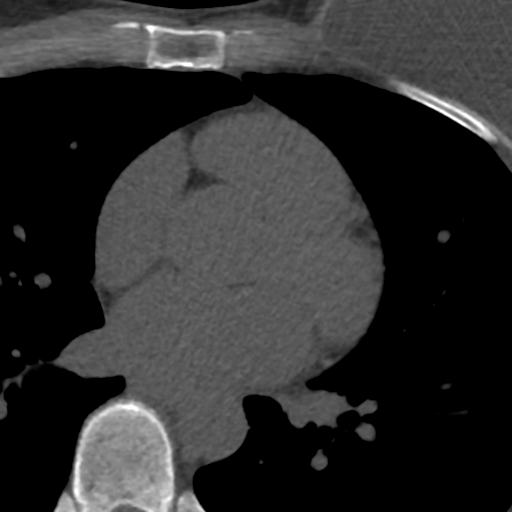
[im 39/51  vessel]
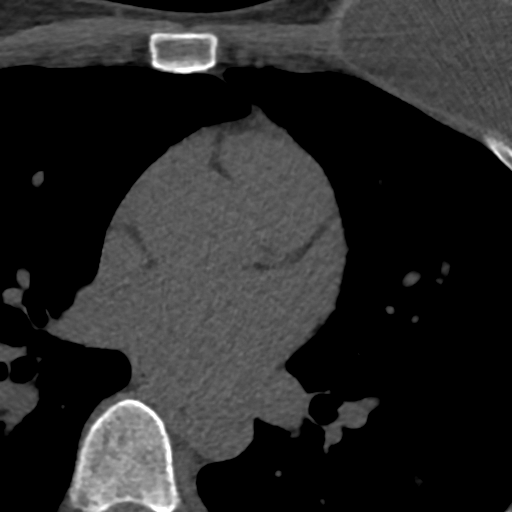
[im 45/51  vessel]
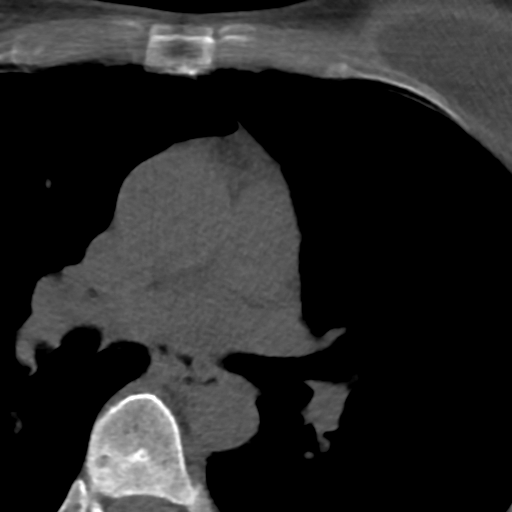

[Series 4: lung st 68 % · axial · 0.64mm/px · z∈[-302,-186]mm · 8 of 51 slices shown]
[im 6/51  lung]
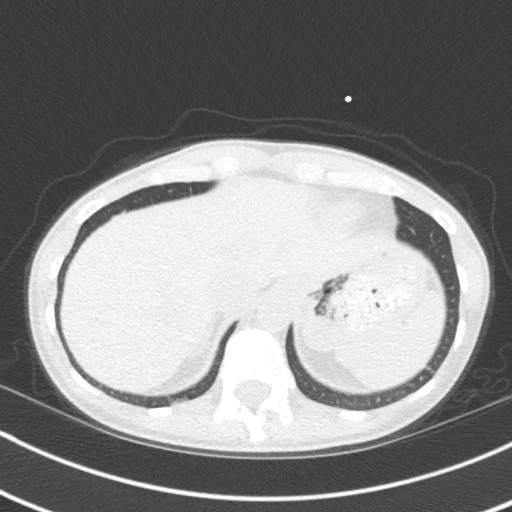
[im 12/51  lung]
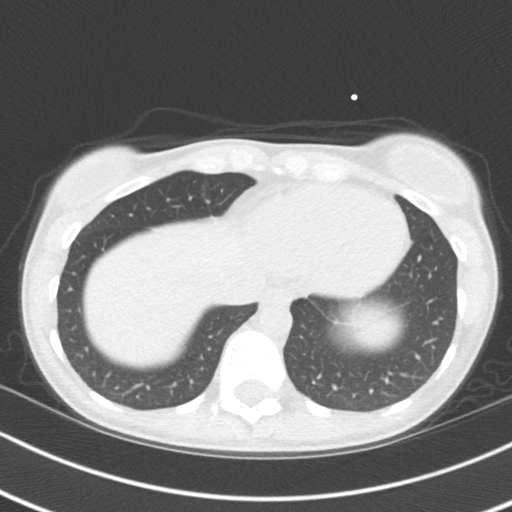
[im 17/51  lung]
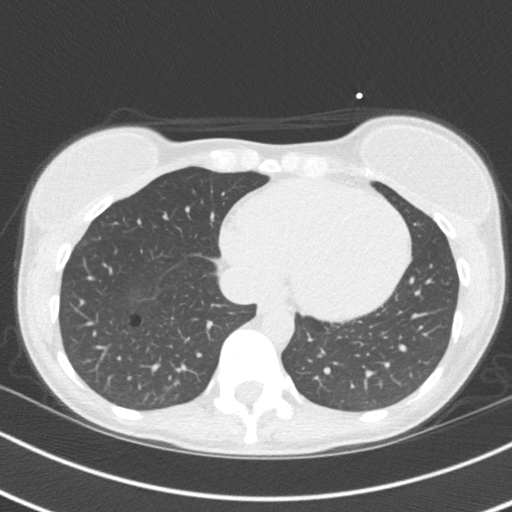
[im 23/51  lung]
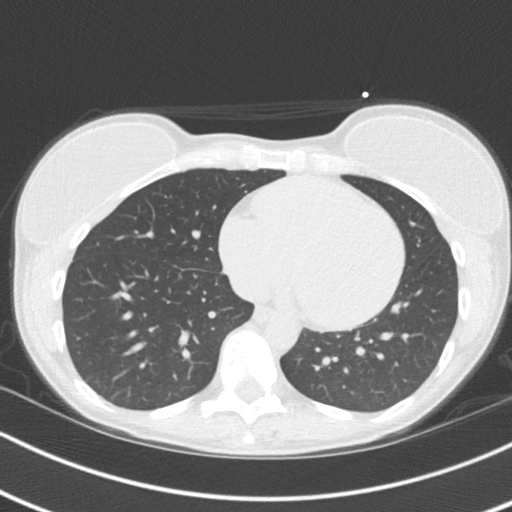
[im 28/51  lung]
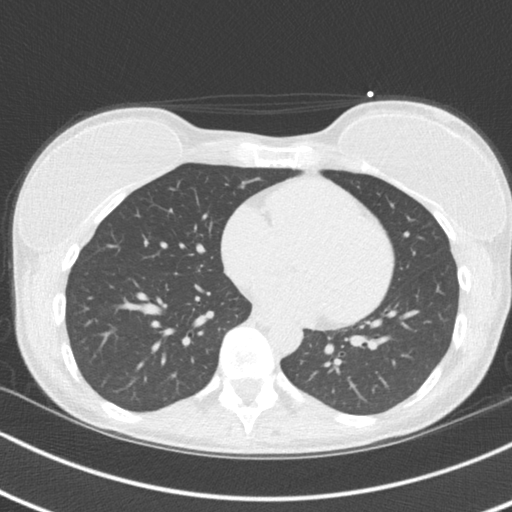
[im 34/51  lung]
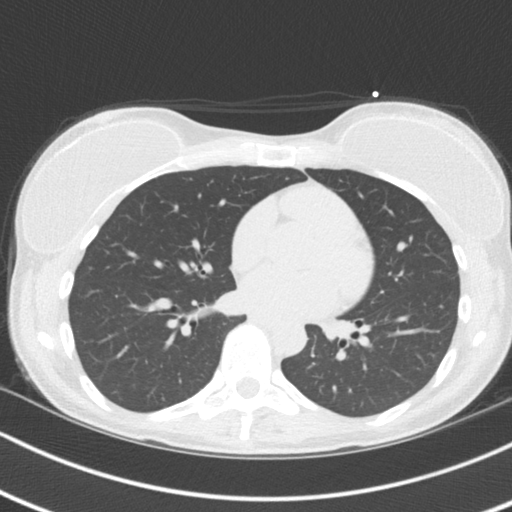
[im 39/51  lung]
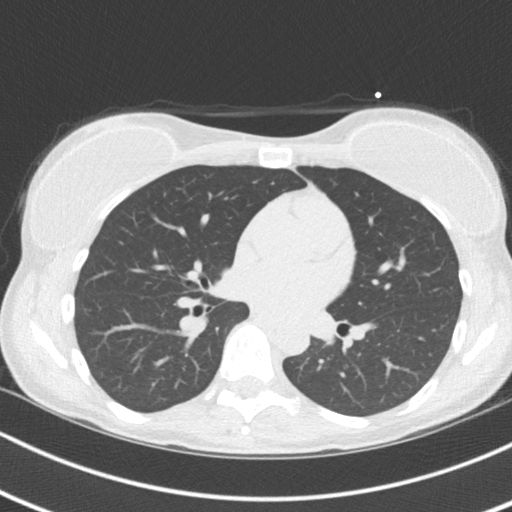
[im 45/51  lung]
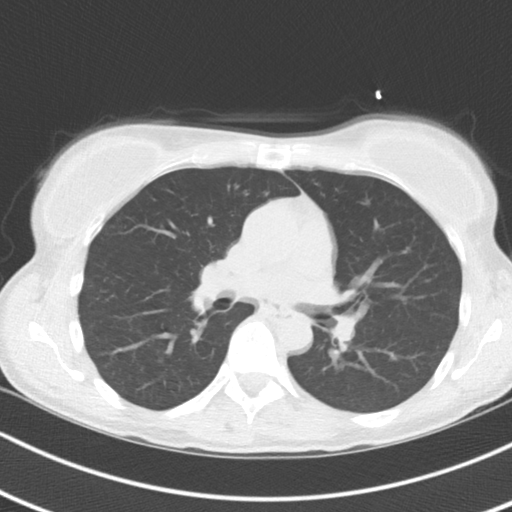

[16 of 20 positions shown; findings below may reference images not displayed]

FINDINGS: Non-cardiac: See separate report from [REDACTED].

Ascending Aorta:  Normal size.  No calcifications.

Pericardium: Normal.

Coronary arteries:  Normal origin.
IMPRESSION: Coronary calcium score of 0. This was 0 percentile for age and sex
matched control.

Deven Casagrande

EXAM:
OVER-READ INTERPRETATION  CT CHEST

The following report is an over-read performed by radiologist Dr.
Onlain Chafidze [REDACTED] on 01/08/2017. This over-read
does not include interpretation of cardiac or coronary anatomy or
pathology. The coronary calcium score interpretation by the
cardiologist is attached.
FINDINGS: Cardiovascular: Heart is normal size. Ascending aorta upper limits
normal in maximum diameter at 3.5 cm.

Mediastinum/Nodes: No adenopathy in the lower mediastinum or hila.

Lungs/Pleura: Visualized lungs are clear.  No effusions.

Upper Abdomen: Imaging into the upper abdomen shows no acute
findings.

Musculoskeletal: Bilateral breast implants noted. Chest wall soft
tissues otherwise unremarkable. No acute bony abnormality.
IMPRESSION: No acute or significant extracardiac abnormality.

## 2018-10-27 ENCOUNTER — Other Ambulatory Visit: Payer: Self-pay

## 2018-10-29 ENCOUNTER — Other Ambulatory Visit: Payer: Self-pay

## 2018-10-29 ENCOUNTER — Ambulatory Visit (INDEPENDENT_AMBULATORY_CARE_PROVIDER_SITE_OTHER): Admitting: Gynecology

## 2018-10-29 ENCOUNTER — Encounter: Payer: Self-pay | Admitting: Gynecology

## 2018-10-29 VITALS — BP 118/74 | Ht 67.0 in | Wt 140.0 lb

## 2018-10-29 DIAGNOSIS — Z01411 Encounter for gynecological examination (general) (routine) with abnormal findings: Secondary | ICD-10-CM

## 2018-10-29 DIAGNOSIS — N750 Cyst of Bartholin's gland: Secondary | ICD-10-CM | POA: Diagnosis not present

## 2018-10-29 DIAGNOSIS — Z1151 Encounter for screening for human papillomavirus (HPV): Secondary | ICD-10-CM

## 2018-10-29 NOTE — Addendum Note (Signed)
Addended by: Dayna Barker on: 10/29/2018 01:24 PM   Modules accepted: Orders

## 2018-10-29 NOTE — Progress Notes (Signed)
    Amy Campos Oct 26, 1970 [id number]        48 y.o.  G4P4 for annual gynecologic exam.  Without gynecologic complaints  Past medical history,surgical history, problem list, medications, allergies, family history and social history were all reviewed and documented as reviewed in the EPIC chart.  ROS:  Performed with pertinent positives and negatives included in the history, assessment and plan.   Additional significant findings : None   Exam: Amy Campos assistant Vitals:   10/29/18 1216  BP: 118/74  Weight: 140 lb (63.5 kg)  Height: 5\' 7"  (1.702 m)   Body mass index is 21.93 kg/m.  General appearance:  Normal affect, orientation and appearance. Skin: Grossly normal HEENT: Without gross lesions.  No cervical or supraclavicular adenopathy. Thyroid normal.  Lungs:  Clear without wheezing, rales or rhonchi Cardiac: RR, without RMG Abdominal:  Soft, nontender, without masses, guarding, rebound, organomegaly or hernia Breasts:  Examined lying and sitting without masses, retractions, discharge or axillary adenopathy.  Bilateral implants noted Pelvic:  Ext, BUS, Vagina: Small right classic Bartholin cyst  Cervix: Normal.  Pap smear/HPV  Uterus: Anteverted, normal size, shape and contour, midline and mobile nontender   Adnexa: Without masses or tenderness    Anus and perineum: Normal   Rectovaginal: Normal sphincter tone without palpated masses or tenderness.    Assessment/Plan:  48 y.o. G64P4 female for annual gynecologic exam.  With regular menses, vasectomy birth control  1. Small right Bartholin cyst.  Never noticed before on exam.  Patient has no symptoms.  I have instructed her in self-examination and she will follow.  If remained stable and not bothering her then will follow expectantly.  If enlarges or causes symptoms she will follow-up for further evaluation. 2. Mammography 2018.  Reminded patient she is overdue and asked her to schedule a screening mammogram.   Breast exam normal today. 3. Pap smear/HPV 2017.  Pap smear/HPV today.  No history of abnormal Pap smears previously. 4. Health maintenance.  Offered routine blood work.  Patient declined and states that she will do this later.  She understands the issues of missed pathology.  Follow-up in 1 year, sooner as needed.   Amy Campos Amy Ribas MD, 1:20 PM 10/29/2018

## 2018-10-29 NOTE — Patient Instructions (Signed)
Follow the right Bartholin cyst as we discussed.  Call if you feel that it is getting larger or it causes you any discomfort.  Schedule your screening mammogram

## 2018-10-31 LAB — PAP IG AND HPV HIGH-RISK: HPV DNA High Risk: NOT DETECTED

## 2019-03-03 ENCOUNTER — Encounter: Payer: Self-pay | Admitting: Gynecology

## 2019-09-22 DIAGNOSIS — N939 Abnormal uterine and vaginal bleeding, unspecified: Secondary | ICD-10-CM | POA: Insufficient documentation

## 2019-11-03 ENCOUNTER — Encounter: Admitting: Gynecology

## 2019-11-03 ENCOUNTER — Encounter: Admitting: Obstetrics and Gynecology

## 2020-01-09 HISTORY — PX: ABLATION: SHX5711

## 2020-04-20 ENCOUNTER — Other Ambulatory Visit: Payer: Self-pay

## 2020-04-20 ENCOUNTER — Encounter: Payer: Self-pay | Admitting: Family Medicine

## 2020-04-20 ENCOUNTER — Ambulatory Visit (INDEPENDENT_AMBULATORY_CARE_PROVIDER_SITE_OTHER): Admitting: Family Medicine

## 2020-04-20 DIAGNOSIS — D500 Iron deficiency anemia secondary to blood loss (chronic): Secondary | ICD-10-CM

## 2020-04-20 DIAGNOSIS — D509 Iron deficiency anemia, unspecified: Secondary | ICD-10-CM | POA: Insufficient documentation

## 2020-04-20 NOTE — Progress Notes (Signed)
Patient: Amy Campos MRN: [id number] DOB: Feb 05, 1971 PCP: Orland Mustard, MD      Subjective:  Chief Complaint  Patient presents with  . Establish Care    HPI: The patient is a 49 y.o. female who presents today to establish care. She has no concerns today. [facility]. Hx of iron deficiency anemia due to abnormal uterine bleeding. Had ablation done this year. Otherwise healthy. On progesterone from [facility] and has done testosterone pellets as well. Hx of insomnia with very infrequent use of ambien. px'd by gyn.    No family hx of colon cancer or breast cancer.   Colonoscopy: never Mammogram: 2-3 years ago.  Pap smear: 10/29/2018 Does not do flu vaccine.    Review of Systems  Constitutional: Negative for chills, fatigue and fever.  HENT: Negative for dental problem, ear pain, hearing loss and trouble swallowing.   Eyes: Negative for visual disturbance.  Respiratory: Negative for cough, chest tightness and shortness of breath.   Cardiovascular: Negative for chest pain, palpitations and leg swelling.  Gastrointestinal: Negative for abdominal pain, blood in stool, diarrhea and nausea.  Endocrine: Negative for cold intolerance, polydipsia, polyphagia and polyuria.  Genitourinary: Negative for dysuria and hematuria.  Musculoskeletal: Negative for arthralgias.  Skin: Negative for rash.  Neurological: Negative for dizziness and headaches.  Psychiatric/Behavioral: Negative for dysphoric mood and sleep disturbance. The patient is not nervous/anxious.     Allergies Patient is allergic to keflex [cephalexin].  Past Medical History Patient  has no past medical history on file.  Surgical History Patient  has a past surgical history that includes Bladder suspension; Sympathectomy; Cesarean section; Augmentation mammaplasty (Bilateral); Laser vein procedure; and Ablation (01/2020).  Family History Pateint's family history includes Alcohol  abuse in her maternal grandfather and paternal grandfather; Alzheimer's disease (age of onset: 62) in her maternal grandmother; Cancer in her father; Heart Problems in her mother; Leukemia (age of onset: 54) in her maternal grandfather.  Social History Patient  reports that she has never smoked. She has never used smokeless tobacco. She reports current alcohol use of about 3.0 standard drinks of alcohol per week. She reports that she does not use drugs.    Objective: Vitals:   04/20/20 1058  BP: 123/79  Pulse: 66  Temp: 98.2 F (36.8 C)  TempSrc: Temporal  SpO2: 100%  Weight: 144 lb 12.8 oz (65.7 kg)  Height: 5\' 7"  (1.702 m)    Body mass index is 22.68 kg/m.  Physical Exam Vitals reviewed.  Constitutional:      Appearance: Normal appearance. She is well-developed and normal weight.  HENT:     Head: Normocephalic and atraumatic.     Right Ear: Tympanic membrane, ear canal and external ear normal.     Left Ear: Tympanic membrane, ear canal and external ear normal.     Mouth/Throat:     Mouth: Mucous membranes are moist.  Eyes:     Extraocular Movements: Extraocular movements intact.     Conjunctiva/sclera: Conjunctivae normal.     Pupils: Pupils are equal, round, and reactive to light.  Neck:     Thyroid: No thyromegaly.     Vascular: No carotid bruit.  Cardiovascular:     Rate and Rhythm: Normal rate and regular rhythm.     Pulses: Normal pulses.     Heart sounds: Normal heart sounds. No murmur heard.   Pulmonary:     Effort: Pulmonary effort is normal.     Breath sounds: Normal  breath sounds.  Abdominal:     General: Abdomen is flat. Bowel sounds are normal. There is no distension.     Palpations: Abdomen is soft.     Tenderness: There is no abdominal tenderness.  Musculoskeletal:     Cervical back: Normal range of motion and neck supple.  Lymphadenopathy:     Cervical: No cervical adenopathy.  Skin:    General: Skin is warm and dry.     Capillary Refill:  Capillary refill takes less than 2 seconds.     Findings: No rash.  Neurological:     General: No focal deficit present.     Mental Status: She is alert and oriented to person, place, and time.     Cranial Nerves: No cranial nerve deficit.     Coordination: Coordination normal.     Deep Tendon Reflexes: Reflexes normal.  Psychiatric:        Mood and Affect: Mood normal.        Behavior: Behavior normal.     Assessment/plan: 1. Iron deficiency anemia due to chronic blood loss Labs [facility] at [facility] and anemia much improved since ablation. She is otherwise healthy. Labs all wnl. Did not have lipid panel done and doesn't want to do any labs today.   HM reviewed and discussed. Recommended mammogram and colon cancer screening. Will think on this.   Recommended tdap as well.  Very fit and healthy. Continue healthy lifestyle.    This visit occurred during the SARS-CoV-2 public health emergency.  Safety protocols were in place, including screening questions prior to the visit, additional usage of staff PPE, and extensive cleaning of exam room while observing appropriate contact time as indicated for disinfecting solutions.     Return in about 1 year (around 04/20/2021).   Orland Mustard, MD [facility]   04/20/2020

## 2020-04-20 NOTE — Patient Instructions (Signed)
   1) vitamin D3 2000IU/day 2) vitamin C: 1000mg  daily 3) quercetin: 500mg  daily 4) melatonin 6mg  before bedtime (may cause drowsiness)   Gargle mouthwash like scope/act/crest twice a day.   Call me right away if you get covid/set up telehealth appointment so we can discuss treatment.  Make sure you have pulse ox as well if you get covid and call me asap.

## 2020-05-12 ENCOUNTER — Other Ambulatory Visit: Payer: Self-pay | Admitting: Family Medicine

## 2020-05-12 DIAGNOSIS — Z1231 Encounter for screening mammogram for malignant neoplasm of breast: Secondary | ICD-10-CM

## 2020-05-18 ENCOUNTER — Ambulatory Visit: Admitting: Family Medicine

## 2020-06-14 ENCOUNTER — Telehealth: Payer: Self-pay

## 2020-06-14 NOTE — Telephone Encounter (Signed)
Patient was prescribed ivermectin when she came in for an appointment. States that everyone around her seems to be testing positive, wondered if she can take it as if she has covid, even if she has no symptoms.

## 2020-06-14 NOTE — Telephone Encounter (Signed)
Please Advise

## 2020-06-14 NOTE — Telephone Encounter (Signed)
Called and answered her questions. Would not treat if asymptomatic.  Orland Mustard, MD [facility]

## 2020-06-23 ENCOUNTER — Ambulatory Visit

## 2020-07-11 ENCOUNTER — Telehealth: Payer: Self-pay

## 2020-07-11 NOTE — Telephone Encounter (Signed)
I spoke with pt to let her know that Dr. Artis Flock is out of the office today, but I will forward the refill request to address when she returns to the office.

## 2020-07-11 NOTE — Telephone Encounter (Signed)
Pt is requesting a refill of ivermectin.

## 2020-07-12 NOTE — Telephone Encounter (Signed)
Called and talked to patient.  Elky Funches, MD [facility]   

## 2020-08-04 ENCOUNTER — Ambulatory Visit

## 2020-10-03 ENCOUNTER — Other Ambulatory Visit: Payer: Self-pay | Admitting: Family Medicine

## 2020-10-03 MED ORDER — ACYCLOVIR 5 % EX CREA: TOPICAL_CREAM | CUTANEOUS | 1 refills | Status: AC

## 2020-10-03 MED ORDER — ACYCLOVIR 400 MG PO TABS: 400.0000 mg | ORAL_TABLET | Freq: Three times a day (TID) | ORAL | 3 refills | Status: DC

## 2020-12-27 ENCOUNTER — Telehealth: Payer: Self-pay

## 2020-12-27 NOTE — Telephone Encounter (Signed)
Talked to pt that Dr Artis Flock has left our office. Pt stated that she would look for another provider somewhere else

## 2022-02-06 ENCOUNTER — Other Ambulatory Visit: Payer: Self-pay | Admitting: Plastic Surgery

## 2022-02-06 DIAGNOSIS — Z1231 Encounter for screening mammogram for malignant neoplasm of breast: Secondary | ICD-10-CM

## 2022-02-21 ENCOUNTER — Ambulatory Visit
Admission: RE | Admit: 2022-02-21 | Discharge: 2022-02-21 | Disposition: A | Discharge status: Home / Self Care | Source: Ambulatory Visit | Attending: Plastic Surgery | Admitting: Plastic Surgery

## 2022-02-21 DIAGNOSIS — Z1231 Encounter for screening mammogram for malignant neoplasm of breast: Secondary | ICD-10-CM

## 2024-06-30 ENCOUNTER — Other Ambulatory Visit: Payer: Self-pay | Admitting: Family Medicine

## 2024-06-30 DIAGNOSIS — M5412 Radiculopathy, cervical region: Secondary | ICD-10-CM

## 2024-07-07 ENCOUNTER — Ambulatory Visit
Admission: RE | Admit: 2024-07-07 | Discharge: 2024-07-07 | Disposition: A | Source: Ambulatory Visit | Attending: Family Medicine

## 2024-07-07 DIAGNOSIS — M5412 Radiculopathy, cervical region: Secondary | ICD-10-CM
